# Patient Record
Sex: Female | Born: 1970 | Race: Black or African American | Hispanic: No | Marital: Single | Smoking: Never smoker
Health system: Southern US, Community
[De-identification: ages and names within clinical notes are randomized; demographics above are authoritative.]

## PROBLEM LIST (undated history)

## (undated) HISTORY — PX: OTHER SURGICAL HISTORY: SHX169

---

## 2014-03-01 ENCOUNTER — Emergency Department (HOSPITAL_COMMUNITY): Payer: Self-pay

## 2014-03-01 ENCOUNTER — Emergency Department (HOSPITAL_COMMUNITY)
Admission: EM | Admit: 2014-03-01 | Discharge: 2014-03-01 | Disposition: A | Discharge status: Home / Self Care | Payer: Self-pay | Attending: Emergency Medicine | Admitting: Emergency Medicine

## 2014-03-01 ENCOUNTER — Encounter (HOSPITAL_COMMUNITY): Payer: Self-pay

## 2014-03-01 DIAGNOSIS — Z8719 Personal history of other diseases of the digestive system: Secondary | ICD-10-CM | POA: Insufficient documentation

## 2014-03-01 DIAGNOSIS — M545 Low back pain: Secondary | ICD-10-CM | POA: Insufficient documentation

## 2014-03-01 DIAGNOSIS — E663 Overweight: Secondary | ICD-10-CM | POA: Insufficient documentation

## 2014-03-01 DIAGNOSIS — R102 Pelvic and perineal pain: Secondary | ICD-10-CM

## 2014-03-01 DIAGNOSIS — O26899 Other specified pregnancy related conditions, unspecified trimester: Secondary | ICD-10-CM

## 2014-03-01 DIAGNOSIS — N7011 Chronic salpingitis: Secondary | ICD-10-CM | POA: Insufficient documentation

## 2014-03-01 DIAGNOSIS — Z3202 Encounter for pregnancy test, result negative: Secondary | ICD-10-CM | POA: Insufficient documentation

## 2014-03-01 DIAGNOSIS — D259 Leiomyoma of uterus, unspecified: Secondary | ICD-10-CM | POA: Insufficient documentation

## 2014-03-01 LAB — COMPREHENSIVE METABOLIC PANEL
ALBUMIN: 3.4 g/dL — AB (ref 3.5–5.2)
ALK PHOS: 66 U/L (ref 39–117)
ALT: 7 U/L (ref 0–35)
AST: 13 U/L (ref 0–37)
Anion gap: 13 (ref 5–15)
BILIRUBIN TOTAL: 0.3 mg/dL (ref 0.3–1.2)
BUN: 6 mg/dL (ref 6–23)
CHLORIDE: 102 meq/L (ref 96–112)
CO2: 22 mEq/L (ref 19–32)
Calcium: 8.9 mg/dL (ref 8.4–10.5)
Creatinine, Ser: 0.65 mg/dL (ref 0.50–1.10)
GFR calc Af Amer: >90 mL/min (ref >90)
GFR calc non Af Amer: >90 mL/min (ref >90)
Glucose, Bld: 87 mg/dL (ref 70–99)
POTASSIUM: 3.7 meq/L (ref 3.7–5.3)
SODIUM: 137 meq/L (ref 137–147)
TOTAL PROTEIN: 6.8 g/dL (ref 6.0–8.3)

## 2014-03-01 LAB — URINALYSIS, ROUTINE W REFLEX MICROSCOPIC
Bilirubin Urine: NEGATIVE
Glucose, UA: NEGATIVE mg/dL
HGB URINE DIPSTICK: NEGATIVE
Ketones, ur: NEGATIVE mg/dL
LEUKOCYTES UA: NEGATIVE
Nitrite: NEGATIVE
PROTEIN: NEGATIVE mg/dL
SPECIFIC GRAVITY, URINE: 1.017 (ref 1.005–1.030)
Urobilinogen, UA: 0.2 mg/dL (ref 0.0–1.0)
pH: 7 (ref 5.0–8.0)

## 2014-03-01 LAB — CBC WITH DIFFERENTIAL/PLATELET
BASOS PCT: 1 % (ref 0–1)
Basophils Absolute: 0 10*3/uL (ref 0.0–0.1)
EOS ABS: 0 10*3/uL (ref 0.0–0.7)
Eosinophils Relative: 0 % (ref 0–5)
HCT: 36.7 % (ref 36.0–46.0)
HEMOGLOBIN: 11.8 g/dL — AB (ref 12.0–15.0)
Lymphocytes Relative: 35 % (ref 12–46)
Lymphs Abs: 1.7 10*3/uL (ref 0.7–4.0)
MCH: 26.3 pg (ref 26.0–34.0)
MCHC: 32.2 g/dL (ref 30.0–36.0)
MCV: 81.7 fL (ref 78.0–100.0)
MONOS PCT: 6 % (ref 3–12)
Monocytes Absolute: 0.3 10*3/uL (ref 0.1–1.0)
NEUTROS ABS: 2.8 10*3/uL (ref 1.7–7.7)
NEUTROS PCT: 58 % (ref 43–77)
PLATELETS: 398 10*3/uL (ref 150–400)
RBC: 4.49 MIL/uL (ref 3.87–5.11)
RDW: 15.4 % (ref 11.5–15.5)
WBC: 4.7 10*3/uL (ref 4.0–10.5)

## 2014-03-01 LAB — LIPASE, BLOOD: LIPASE: 21 U/L (ref 11–59)

## 2014-03-01 LAB — HIV ANTIBODY (ROUTINE TESTING W REFLEX): HIV: NONREACTIVE

## 2014-03-01 LAB — WET PREP, GENITAL
Trich, Wet Prep: NONE SEEN
Yeast Wet Prep HPF POC: NONE SEEN

## 2014-03-01 LAB — RPR

## 2014-03-01 LAB — PREGNANCY, URINE: PREG TEST UR: NEGATIVE

## 2014-03-01 MED ORDER — AZITHROMYCIN 250 MG PO TABS
1000.0000 mg | ORAL_TABLET | Freq: Once | ORAL | Status: AC
  Administered 2014-03-01: 1000 mg via ORAL
  Filled 2014-03-01: qty 4

## 2014-03-01 MED ORDER — STERILE WATER FOR INJECTION IJ SOLN
0.9000 mL | Freq: Once | INTRAMUSCULAR | Status: AC
  Administered 2014-03-01: 0.9 mL via INTRAMUSCULAR

## 2014-03-01 MED ORDER — METRONIDAZOLE 500 MG PO TABS: 500.0000 mg | ORAL_TABLET | Freq: Two times a day (BID) | ORAL | Status: AC

## 2014-03-01 MED ORDER — SODIUM CHLORIDE 0.9 % IV BOLUS (SEPSIS)
1000.0000 mL | Freq: Once | INTRAVENOUS | Status: AC
  Administered 2014-03-01: 1000 mL via INTRAVENOUS

## 2014-03-01 MED ORDER — IBUPROFEN 800 MG PO TABS
800.0000 mg | ORAL_TABLET | Freq: Once | ORAL | Status: AC
  Administered 2014-03-01: 800 mg via ORAL
  Filled 2014-03-01: qty 1

## 2014-03-01 MED ORDER — IOHEXOL 300 MG/ML  SOLN
80.0000 mL | Freq: Once | INTRAMUSCULAR | Status: AC | PRN
  Administered 2014-03-01: 80 mL via INTRAVENOUS

## 2014-03-01 MED ORDER — IOHEXOL 300 MG/ML  SOLN
25.0000 mL | Freq: Once | INTRAMUSCULAR | Status: AC | PRN
  Administered 2014-03-01: 25 mL via ORAL

## 2014-03-01 MED ORDER — DOXYCYCLINE HYCLATE 100 MG PO CAPS: 100.0000 mg | ORAL_CAPSULE | Freq: Two times a day (BID) | ORAL | Status: AC

## 2014-03-01 MED ORDER — CEFTRIAXONE SODIUM 250 MG IJ SOLR
250.0000 mg | Freq: Once | INTRAMUSCULAR | Status: AC
  Administered 2014-03-01: 250 mg via INTRAMUSCULAR
  Filled 2014-03-01: qty 250

## 2014-03-01 MED ORDER — MORPHINE SULFATE 2 MG/ML IJ SOLN
2.0000 mg | Freq: Once | INTRAMUSCULAR | Status: AC
Start: 2014-03-01 — End: 2014-03-01
  Administered 2014-03-01: 2 mg via INTRAVENOUS
  Filled 2014-03-01: qty 1

## 2014-03-01 NOTE — ED Notes (Signed)
Phlebotomy at the bedside  

## 2014-03-01 NOTE — Discharge Instructions (Signed)
Please call your doctor for a followup appointment within 24-48 hours. When you talk to your doctor please let them know that you were seen in the emergency department and have them acquire all of your records so that they can discuss the findings with you and formulate a treatment plan to fully care for your new and ongoing problems. Please call and set-up an appointment with Health and [place] Please call and set-up an appointment with OBGYN - within 2 weeks Please call and set-up an appointment with Health Department to be seen - please have partner(s) within the past 6 months tested as well for STDs Please take medications as prescribed and on a full stomach Please continue to monitor symptoms closely and if symptoms are to worsen or change (fever greater than 101, chills, sweating, nausea, vomiting, chest pain, shortness of breathe, difficulty breathing, weakness, numbness, tingling, worsening or changes to pain pattern, vaginal discharge, abnormal vaginal bleeding, worsening or changes to abdominal or back pain, dizziness, headaches) please report back to the Emergency Department immediately.   Pelvic Inflammatory Disease Pelvic inflammatory disease (PID) refers to an infection in some or all of the female organs. The infection can be in the uterus, ovaries, fallopian tubes, or the surrounding tissues in the pelvis. PID can cause abdominal or pelvic pain that comes on suddenly (acute pelvic pain). PID is a serious infection because it can lead to lasting (chronic) pelvic pain or the inability to have children (infertile).  CAUSES  The infection is often caused by the normal bacteria found in the vaginal tissues. PID may also be caused by an infection that is spread during sexual contact. PID can also occur following:   The birth of a baby.   A miscarriage.   An abortion.   Major pelvic surgery.   The use of an intrauterine device (IUD).   A sexual assault.  RISK  FACTORS Certain factors can put a person at higher risk for PID, such as:  Being younger than 25 years.  Being sexually active at [place] age.  Usingnonbarrier contraception.  Havingmultiple sexual partners.  Having sex with someone who has symptoms of a genital infection.  Using oral contraception. Other times, certain behaviors can increase the possibility of getting PID, such as:  Having sex during your period.  Using a vaginal douche.  Having an intrauterine device (IUD) in place. SYMPTOMS   Abdominal or pelvic pain.   Fever.   Chills.   Abnormal vaginal discharge.  Abnormal uterine bleeding.   Unusual pain shortly after finishing your period. DIAGNOSIS  Your caregiver will choose some of the following methods to make a diagnosis, such as:   Performinga physical exam and history. A pelvic exam typically reveals a very tender uterus and surrounding pelvis.   Ordering laboratory tests including a pregnancy test, blood tests, and urine test.  Orderingcultures of the vagina and cervix to check for a sexually transmitted infection (STI).  Performing an ultrasound.   Performing a laparoscopic procedure to look inside the pelvis.  TREATMENT   Antibiotic medicines may be prescribed and taken by mouth.   Sexual partners may be treated when the infection is caused by a sexually transmitted disease (STD).   Hospitalization may be needed to give antibiotics intravenously.  Surgery may be needed, but this is rare. It may take weeks until you are completely well. If you are diagnosed with PID, you should also be checked for human immunodeficiency virus (HIV). HOME CARE INSTRUCTIONS   If  given, take your antibiotics as directed. Finish the medicine even if you start to feel better.   Only take over-the-counter or prescription medicines for pain, discomfort, or fever as directed by your caregiver.   Do not have sexual intercourse until treatment is  completed or as directed by your caregiver. If PID is confirmed, your recent sexual partner(s) will need treatment.   Keep your follow-up appointments. SEEK MEDICAL CARE IF:   You have increased or abnormal vaginal discharge.   You need prescription medicine for your pain.   You vomit.   You cannot take your medicines.   Your partner has an STD.  SEEK IMMEDIATE MEDICAL CARE IF:   You have a fever.   You have increased abdominal or pelvic pain.   You have chills.   You have pain when you urinate.   You are not better after 72 hours following treatment.  MAKE SURE YOU:   Understand these instructions.  Will watch your condition.  Will get help right away if you are not doing well or get worse. Document Released: 03/16/2005 Document Revised: 07/11/2012 Document Reviewed: 03/12/2011 [facility] Patient Information 2015 [place], [place]. This information is not intended to replace advice given to you by your health care provider. Make sure you discuss any questions you have with your health care provider.    Emergency Department Resource Guide 1) Find a Doctor and Pay Out of Pocket Although you won't have to find out who is covered by your insurance plan, it is a good idea to ask around and get recommendations. You will then need to call the office and see if the doctor you have chosen will accept you as a new patient and what types of options they offer for patients who are self-pay. Some doctors offer discounts or will set up payment plans for their patients who do not have insurance, but you will need to ask so you aren't surprised when you get to your appointment.  2) Contact Your Local Health Department Not all health departments have doctors that can see patients for sick visits, but many do, so it is worth a call to see if yours does. If you don't know where your local health department is, you can check in your phone book. The CDC also has a tool to help you locate  your state's health department, and many state websites also have listings of all of their local health departments.  3) Find a [facility] If your illness is not likely to be very severe or complicated, you may want to try a walk in clinic. These are popping up all over the country in pharmacies, drugstores, and shopping centers. They're usually staffed by nurse practitioners or physician assistants that have been trained to treat common illnesses and complaints. They're usually fairly quick and inexpensive. However, if you have serious medical issues or chronic medical problems, these are probably not your best option.  No Primary Care Doctor: - Call Health Connect at  [phone] - they can help you locate a primary care doctor that  accepts your insurance, provides certain services, etc. - Physician Referral Service- [phone]  Chronic Pain Problems: Organization         Address  Phone   Notes  [facility]  [phone] Patients need to be referred by their primary care doctor.   Medication Assistance: Organization         Address  Phone   Notes  [facility] Medication Assistance Program  [address] [place], [place] [address] [phone] --Must be a resident of [facility] -- Must have NO insurance coverage whatsoever (no Medicaid/ Medicare, etc.) -- The pt. MUST have a primary care doctor that directs their care regularly and follows them in the community   MedAssist  [phone]   [employer]  [phone]    Agencies that provide inexpensive medical care: Organization         Address  Phone   Notes  [place]  [phone]   [facility]    [phone]   [facility] [place], [place] [address] [phone]   [place] [address] [place] [phone]   Planned Parenthood    [phone]   [facility]     [phone]   [place] and [place] [address], [place] Phone:  [phone], Fax:  [phone] Hours of Operation:  9 am - 6 pm, M-F.  Also accepts Medicaid/Medicare and self-pay.  [facility] for [place], [address], [place] Phone: [phone], Fax: [phone]. Hours of Operation:  8:30 am - 5:30 pm, M-F.  Also accepts Medicaid and self-pay.  [facility] [place] [address] [place] Phone: [phone]   [place], [place], [place] [phone], Ext. [phone] Mondays & Thursdays: 7-9 AM.  First 15 patients are seen on a first come, first serve basis.    [place] Providers:  Organization         Address  Phone   Notes  [facility] [address], [place] [phone] Also accepts self-pay patients.  [facility] [address], [place]  [phone]   [place], [address], [place] [phone]   [facility] [address], [place] [phone]   Lucianne Lei [address], [place]   [phone] Only accepts [place] Access [place] patients after they have their name applied to their card.   Self-Pay (no insurance) in [facility]:  Organization         Address  Phone   Notes  Sickle Cell Patients, [facility] [place] [phone]   [facility] [place] [phone]   [facility] [place]  [place], [place], [place] [phone]   [facility]/Dr. Osei-Bonsu  [address], [place] or [address], [place] [phone] Phone number for both [place] and [place] locations is the same.  [facility] [address] [place] [phone]   [facility] [address] [place] or [address] [phone]   [facility] [address] [place] [phone], phone; [phone], fax Sees patients 1st and 3rd Saturday of every month.  Must not qualify for public or private insurance (i.e. Medicaid, Medicare, [place] Health Choice, Veterans' Benefits)  Household income should be no more than 200% of the poverty level The clinic cannot treat you if you are pregnant or think you are pregnant  Sexually transmitted diseases are not treated at the clinic.  Dental Care: Organization         Address  Phone  Notes  [facility] Department of [facility] [place] [phone] Accepts children up to age 50 who are enrolled in [place] or [place]; pregnant women with a Medicaid card; and children who have applied for Medicaid or [place] Health Choice, but were declined, whose parents can pay a reduced fee at time of service.  [facility] Department of [facility]  [address], [place] [phone] Accepts children up to age 92 who are enrolled in [place] or [place]; pregnant women with a Medicaid card; and children who have applied for Medicaid or [place] Health Choice, but were declined, whose parents can pay a reduced fee at time of service.  [facility] Adult Dental Access PROGRAM  [place] [phone] Patients are seen by appointment only. Walk-ins are not accepted. [place] will see patients 45 years of age and older. Monday - Tuesday (8am-5pm) Most Wednesdays (8:30-5pm) $30 per visit, cash only  [facility] Adult Dental Access PROGRAM  [address], [facility] [phone] Patients are seen by appointment only. Walk-ins are not accepted. [place] will see patients 80 years of age and older. One Wednesday Evening (Monthly: Volunteer  Based).  $30 per visit, cash only  [place]  [phone] for adults; Children under age 51, call Graduate Pediatric Dentistry at [phone]. Children aged 54-14, please call [phone] to request a pediatric application.  Dental services are provided in all areas of dental care including fillings, crowns and bridges, complete and partial dentures, implants, gum treatment, root canals, and extractions. Preventive care is also provided. Treatment is provided to both adults and children. Patients are selected via a lottery and there is often a waiting list.   [facility] [address] [place]  [phone] [email or web address]   [facility] [address] [place], [place] [phone], Ext. [phone] Second and Fourth Thursday of each month, opens at 6:30 AM; Clinic ends at 9 AM.  Patients are seen on a first-come first-served basis, and a limited number are seen during each clinic.   [facility]  [address] [place]ard Danker [place], [place] [phone]   Eligibility Requirements You must have lived in [place], [place], or [place] counties for at least the last three months.   You cannot be eligible for state or federal sponsored [employer], including [employer], [place], or [employer].   You generally cannot be eligible for healthcare insurance through your employer.    How to apply: Eligibility screenings are held every Tuesday and Wednesday afternoon from 1:00 pm until 4:00 pm. You do not need an appointment for the interview!  [facility] [address], [place], [place]   [place]  [place] Department  [place]  [phone]    Behavioral Health Resources in the Community: Intensive Outpatient Programs Organization         Address  Phone  Notes  [place]. [address] [place], [place] [phone]   [facility]patient [address] [place], [place]   ADS: Alcohol & Drug S[facility] [address], [place], [place]   [place] [address],  [place], [place] or [phone]   Substance Abuse Resources Organization         Address  Phone  Notes  Alcohol and Drug Services  [place]  [phone]   [facility] [place]  [phone]   Chinita Pester  [phone]   Residential & Outpatient Substance Abuse Program  [phone]   Psychological Services Organization         Address  Phone  Notes  [facility] [place]  [place]  [phone]   [place] [address], [place] or [phone]    Mobile Crisis Teams Organization         Address  Phone  Notes  [facility], Mobile Crisis Care Unit  [phone]   [facility]  [address] [place], [place]   [place] Levels [address] [place] [phone]    Self-Help/Support Groups Organization         Address  Phone             Notes  [place]. of [place] - variety of support groups  [place] Call for more information  Narcotics Anonymous (NA), [facility] [address], [employer] [place]  2 meetings at this location   Special educational needs teacher         Address  Phone  Notes  [facility] Residential Treatment [place],    [place]  [phone]   [facility]  [address] [place] [address]96283, [place], [place]   [place], [place] [phone] Admissions: 8am-3pm M-F  Incentives Substance [place] [address]    [place], [place] [phone]   [facility] [address] Organ, [place], [place]    [facility] [address],  [place], [place]   [facility] - Intensive Outpatient [address], [place], [place]   [facility] ([place].) [place].,  [place], [place] [phone] or [phone]   [facility] ([facility]S) [address], [place], [place] Accepts Medicaid  [facility] [address]  [place] [phone] Substance Abuse/Addiction Treatment   [facility] Organization         Address  Phone  Notes  [facility]  [phone]   Domenic Schwab, PhD [address] Arlis Porta [place], [place]   [phone] or [phone]   [facility] [place], [place] [phone]   [facility] [address], [place], [place] [phone] Insurance/Medicaid/sponsorship through [facility] and Families [address]                                    [place], [place] [phone] [place] [address] [place], [place] [phone]    Dr. Adele Schilder  [phone]   [facility] Dept. 1) [address] [place] 2) [place] 3)  [place] [address], [place] [phone]  [phone]   [place] [phone] or [phone] (After Hours)

## 2014-03-01 NOTE — ED Notes (Signed)
PA at Bedside. 

## 2014-03-01 NOTE — ED Notes (Signed)
Pt here for lower back pain and lower abd pain for the past 2 days. No N/V/D. Denies any urinary sx. Hx of right ovarian cyst.

## 2014-03-01 NOTE — ED Provider Notes (Signed)
CSN: [id number]     Arrival date & time 03/01/14  1829 History   First MD Initiated Contact with Patient 03/01/14 [phone]     Chief Complaint  Patient presents with  . Back Pain  . Abdominal Pain     (Consider location/radiation/quality/duration/timing/severity/associated sxs/prior Treatment) The history is provided by the patient. No language interpreter was used.  Sherry Nguyen is a 43 y/o F with PMHx of ovarian cysts presenting to the ED with abdominal pain that has been ongoing for the past 4 days. Patient described the pain as a constant throbbing sensation with radiation to her lower back. Patient reported that the pain worsens with certain sitting positions and standing for long periods of time. Stated that she has used nothing for the pain. Patient reported that she has not been seen by an OBGYN in a long time. Denied fever, chills, chest pain, shortness of breath, difficulty breathing, weakness, numbness, tingling, urinary and bowel incontinence, nausea, vomiting, diarrhea, melena, hematochezia, fall, injuries, dizziness, headaches, hematuria, dysuria, vaginal bleeding/discharge/pain. Denied history of abdominal surgeries. LMP 02/24/2014.  PCP none   History reviewed. No pertinent past medical history. Past Surgical History  Procedure Laterality Date  . Gallstones     No family history on file. History  Substance Use Topics  . Smoking status: Never Smoker   . Smokeless tobacco: Not on file  . Alcohol Use: Yes     Comment: socially   OB History    No data available     Review of Systems  Constitutional: Negative for fever and chills.  Respiratory: Negative for chest tightness and shortness of breath.   Cardiovascular: Negative for chest pain.  Gastrointestinal: Positive for abdominal pain. Negative for nausea, vomiting, diarrhea, constipation, blood in stool and anal bleeding.  Genitourinary: Positive for pelvic pain. Negative for dysuria, hematuria, decreased urine volume,  vaginal bleeding, vaginal discharge and vaginal pain.  Musculoskeletal: Positive for back pain. Negative for neck pain and neck stiffness.  Neurological: Negative for dizziness, weakness, numbness and headaches.      Allergies  Review of patient's allergies indicates no known allergies.  Home Medications   Prior to Admission medications   Medication Sig Start Date End Date Taking? Authorizing Provider  OVER THE COUNTER MEDICATION Take 1 tablet by mouth at bedtime as needed. Sleep aid   Yes Historical Provider, MD  doxycycline (VIBRAMYCIN) 100 MG capsule Take 1 capsule (100 mg total) by mouth 2 (two) times daily. One po bid x 7 days 03/01/14   Mikell Camp, PA-C  metroNIDAZOLE (FLAGYL) 500 MG tablet Take 1 tablet (500 mg total) by mouth 2 (two) times daily. 03/01/14   Laden Fieldhouse, PA-C   BP 136/84 mmHg  Pulse 68  Temp(Src) 98.1 F (36.7 C) (Oral)  Resp 23  Ht 5' (1.524 m)  Wt 175 lb (79.379 kg)  BMI 34.18 kg/m2  SpO2 100%  LMP 02/24/2014 Physical Exam  Constitutional: She is oriented to person, place, and time. She appears well-developed and well-nourished. No distress.  HENT:  Head: Normocephalic and atraumatic.  Eyes: Conjunctivae and EOM are normal. Right eye exhibits no discharge. Left eye exhibits no discharge.  Neck: Normal range of motion. Neck supple. No tracheal deviation present.  Cardiovascular: Normal rate, regular rhythm and normal heart sounds.   Pulses:      Radial pulses are 2+ on the right side, and 2+ on the left side.       Dorsalis pedis pulses are 2+ on the right side, and  2+ on the left side.  Pulmonary/Chest: Effort normal and breath sounds normal. No respiratory distress. She has no wheezes. She has no rales.  Abdominal: Soft. Normal appearance and bowel sounds are normal. She exhibits no distension. There is tenderness in the right lower quadrant and suprapubic area. There is no rebound, no guarding and no CVA tenderness.  Overweight Negative  abdominal distension  BS normoactive in all 4 quadrants Abdomen soft upon palpation  Discomfort upon palpation to the RLQ and suprapubic region  Negative peritoneal signs   Genitourinary:  Pelvic Exam: negative swelling, erythema, inflammation, lesions, sores, deformities noted to the genitalia and vaginal canal. Negative blood in the vaginal vault. Negative discharge identified. Cervix identified negative friability or abnormalities noted. Mild CMT tenderness with pain upon palpation to suprapubic region and right adnexal tenderness. Negative left adnexal tenderness. Exam chaperoned with nurse, Hildred Alamin  Musculoskeletal: Normal range of motion.  Full ROM to upper and lower extremities without difficulty noted, negative ataxia noted.  Lymphadenopathy:    She has no cervical adenopathy.  Neurological: She is alert and oriented to person, place, and time. No cranial nerve deficit. She exhibits normal muscle tone. Coordination normal.  Cranial nerves III-XII grossly intact Strength 5+/5+ to upper and lower extremities bilaterally with resistance applied, equal distribution noted Negative arm drift Fine motor skills intact  Skin: Skin is warm and dry. No rash noted. She is not diaphoretic. No erythema.  Psychiatric: She has a normal mood and affect. Her behavior is normal. Thought content normal.  Nursing note and vitals reviewed.   ED Course  Procedures (including critical care time)  Results for orders placed or performed during the hospital encounter of 03/01/14  Wet prep, genital  Result Value Ref Range   Yeast Wet Prep HPF POC NONE SEEN NONE SEEN   Trich, Wet Prep NONE SEEN NONE SEEN   Clue Cells Wet Prep HPF POC MANY (A) NONE SEEN   WBC, Wet Prep HPF POC FEW (A) NONE SEEN  Urinalysis, Routine w reflex microscopic  Result Value Ref Range   Color, Urine YELLOW YELLOW   APPearance CLEAR CLEAR   Specific Gravity, Urine 1.017 1.005 - 1.030   pH 7.0 5.0 - 8.0   Glucose, UA NEGATIVE  NEGATIVE mg/dL   Hgb urine dipstick NEGATIVE NEGATIVE   Bilirubin Urine NEGATIVE NEGATIVE   Ketones, ur NEGATIVE NEGATIVE mg/dL   Protein, ur NEGATIVE NEGATIVE mg/dL   Urobilinogen, UA 0.2 0.0 - 1.0 mg/dL   Nitrite NEGATIVE NEGATIVE   Leukocytes, UA NEGATIVE NEGATIVE  Pregnancy, urine  Result Value Ref Range   Preg Test, Ur NEGATIVE NEGATIVE  Lipase, blood  Result Value Ref Range   Lipase 21 11 - 59 U/L  CBC with Differential  Result Value Ref Range   WBC 4.7 4.0 - 10.5 K/uL   RBC 4.49 3.87 - 5.11 MIL/uL   Hemoglobin 11.8 (L) 12.0 - 15.0 g/dL   HCT 36.7 36.0 - 46.0 %   MCV 81.7 78.0 - 100.0 fL   MCH 26.3 26.0 - 34.0 pg   MCHC 32.2 30.0 - 36.0 g/dL   RDW 15.4 11.5 - 15.5 %   Platelets 398 150 - 400 K/uL   Neutrophils Relative % 58 43 - 77 %   Neutro Abs 2.8 1.7 - 7.7 K/uL   Lymphocytes Relative 35 12 - 46 %   Lymphs Abs 1.7 0.7 - 4.0 K/uL   Monocytes Relative 6 3 - 12 %   Monocytes Absolute 0.3 0.1 -  1.0 K/uL   Eosinophils Relative 0 0 - 5 %   Eosinophils Absolute 0.0 0.0 - 0.7 K/uL   Basophils Relative 1 0 - 1 %   Basophils Absolute 0.0 0.0 - 0.1 K/uL  Comprehensive metabolic panel  Result Value Ref Range   Sodium 137 137 - 147 mEq/L   Potassium 3.7 3.7 - 5.3 mEq/L   Chloride 102 96 - 112 mEq/L   CO2 22 19 - 32 mEq/L   Glucose, Bld 87 70 - 99 mg/dL   BUN 6 6 - 23 mg/dL   Creatinine, Ser 0.65 0.50 - 1.10 mg/dL   Calcium 8.9 8.4 - 10.5 mg/dL   Total Protein 6.8 6.0 - 8.3 g/dL   Albumin 3.4 (L) 3.5 - 5.2 g/dL   AST 13 0 - 37 U/L   ALT 7 0 - 35 U/L   Alkaline Phosphatase 66 39 - 117 U/L   Total Bilirubin 0.3 0.3 - 1.2 mg/dL   GFR calc non Af Amer >90 >90 mL/min   GFR calc Af Amer >90 >90 mL/min   Anion gap 13 5 - 15    Labs Review Labs Reviewed  WET PREP, GENITAL - Abnormal; Notable for the following:    Clue Cells Wet Prep HPF POC MANY (*)    WBC, Wet Prep HPF POC FEW (*)    All other components within normal limits  CBC WITH DIFFERENTIAL - Abnormal; Notable  for the following:    Hemoglobin 11.8 (*)    All other components within normal limits  COMPREHENSIVE METABOLIC PANEL - Abnormal; Notable for the following:    Albumin 3.4 (*)    All other components within normal limits  GC/CHLAMYDIA PROBE AMP  URINALYSIS, ROUTINE W REFLEX MICROSCOPIC  PREGNANCY, URINE  LIPASE, BLOOD  HIV ANTIBODY (ROUTINE TESTING)  RPR    Imaging Review US Transvaginal Non-ob  03/01/2014   CLINICAL DATA:  Pelvic pain in female.  EXAM: TRANSABDOMINAL AND TRANSVAGINAL ULTRASOUND OF PELVIS  DOPPLER ULTRASOUND OF OVARIES  TECHNIQUE: Both transabdominal and transvaginal ultrasound examinations of the pelvis were performed. Transabdominal technique was performed for global imaging of the pelvis including uterus, ovaries, adnexal regions, and pelvic cul-de-sac.  It was necessary to proceed with endovaginal exam following the transabdominal exam to visualize the endometrium and ovaries. Color and duplex Doppler ultrasound was utilized to evaluate blood flow to the ovaries.  COMPARISON:  CT scan of same day.  FINDINGS: Uterus  Measurements: 9.0 x 6.4 x 4.8 cm. Multiple fibroids are noted, with the largest measuring 3.7 x 3.1 x 3.0 cm arising from the fundus.  Endometrium  Thickness: 6.1 mm which is within normal limits. No focal abnormality visualized.  Right ovary  Measurements: 2.8 x 2.1 x 1.9 cm. Serpiginous tubular structure is noted in right adnexal region consistent with hydrosalpinx. 1.4 cm simple cyst is noted most likely representing follicular cyst.  Left ovary  Not visualized.  Pulsed Doppler evaluation of right ovary demonstrates normal low-resistance arterial and venous waveforms.  Other findings  No free fluid.  IMPRESSION: Fibroid uterus is noted. Right hydrosalpinx is noted. Left ovary is not visualized.   Electronically Signed   By: Sabino Dick M.D.   On: 03/01/2014 14:42   US Pelvis Complete  03/01/2014   CLINICAL DATA:  Pelvic pain in female.  EXAM: TRANSABDOMINAL AND  TRANSVAGINAL ULTRASOUND OF PELVIS  DOPPLER ULTRASOUND OF OVARIES  TECHNIQUE: Both transabdominal and transvaginal ultrasound examinations of the pelvis were performed. Transabdominal technique was performed  for global imaging of the pelvis including uterus, ovaries, adnexal regions, and pelvic cul-de-sac.  It was necessary to proceed with endovaginal exam following the transabdominal exam to visualize the endometrium and ovaries. Color and duplex Doppler ultrasound was utilized to evaluate blood flow to the ovaries.  COMPARISON:  CT scan of same day.  FINDINGS: Uterus  Measurements: 9.0 x 6.4 x 4.8 cm. Multiple fibroids are noted, with the largest measuring 3.7 x 3.1 x 3.0 cm arising from the fundus.  Endometrium  Thickness: 6.1 mm which is within normal limits. No focal abnormality visualized.  Right ovary  Measurements: 2.8 x 2.1 x 1.9 cm. Serpiginous tubular structure is noted in right adnexal region consistent with hydrosalpinx. 1.4 cm simple cyst is noted most likely representing follicular cyst.  Left ovary  Not visualized.  Pulsed Doppler evaluation of right ovary demonstrates normal low-resistance arterial and venous waveforms.  Other findings  No free fluid.  IMPRESSION: Fibroid uterus is noted. Right hydrosalpinx is noted. Left ovary is not visualized.   Electronically Signed   By: Sabino Dick M.D.   On: 03/01/2014 14:42   Ct Abdomen Pelvis W Contrast  03/01/2014   CLINICAL DATA:  Progressive lower abdominal pain and back pain for 2 days.  EXAM: CT ABDOMEN AND PELVIS WITH CONTRAST  TECHNIQUE: Multidetector CT imaging of the abdomen and pelvis was performed using the standard protocol following bolus administration of intravenous contrast.  CONTRAST:  26mL OMNIPAQUE IOHEXOL 300 MG/ML  SOLN  COMPARISON:  None.  FINDINGS: The patient has a right hydrosalpinx. The ovaries appear normal. There are numerous fibroids of varying sizes in the uterus, the largest being in the fundus, 3.6 cm in diameter.   Gallbladder has been removed. Biliary tree is otherwise normal. Liver, spleen, pancreas, adrenal glands, and kidneys are normal except for a 5 mm cyst in posterior aspect of the mid right kidney.  The bowel is normal including the terminal ileum and appendix. No adenopathy. No free air or free fluid. No acute osseous abnormality. Small disc protrusion central and to the left at L4-5. Broad-based disc bulge at L5-S1.  IMPRESSION: Right hydrosalpinx. Numerous fibroids in the uterus. Small disc protrusion at L4-5.   Electronically Signed   By: Rozetta Nunnery M.D.   On: 03/01/2014 13:26   [place] Art/ven Flow Abd Pelv Doppler  03/01/2014   CLINICAL DATA:  Pelvic pain in female.  EXAM: TRANSABDOMINAL AND TRANSVAGINAL ULTRASOUND OF PELVIS  DOPPLER ULTRASOUND OF OVARIES  TECHNIQUE: Both transabdominal and transvaginal ultrasound examinations of the pelvis were performed. Transabdominal technique was performed for global imaging of the pelvis including uterus, ovaries, adnexal regions, and pelvic cul-de-sac.  It was necessary to proceed with endovaginal exam following the transabdominal exam to visualize the endometrium and ovaries. Color and duplex Doppler ultrasound was utilized to evaluate blood flow to the ovaries.  COMPARISON:  CT scan of same day.  FINDINGS: Uterus  Measurements: 9.0 x 6.4 x 4.8 cm. Multiple fibroids are noted, with the largest measuring 3.7 x 3.1 x 3.0 cm arising from the fundus.  Endometrium  Thickness: 6.1 mm which is within normal limits. No focal abnormality visualized.  Right ovary  Measurements: 2.8 x 2.1 x 1.9 cm. Serpiginous tubular structure is noted in right adnexal region consistent with hydrosalpinx. 1.4 cm simple cyst is noted most likely representing follicular cyst.  Left ovary  Not visualized.  Pulsed Doppler evaluation of right ovary demonstrates normal low-resistance arterial and venous waveforms.  Other findings  No  free fluid.  IMPRESSION: Fibroid uterus is noted. Right hydrosalpinx  is noted. Left ovary is not visualized.   Electronically Signed   By: Sabino Dick M.D.   On: 03/01/2014 14:42     EKG Interpretation None      11:59 PM Patient reported that she is interested in being tested for HIV and syphilis secondary to unprotected sexual encounter approximately 3 weeks ago with a new partner.  3:26 PM This provider spoke with Dr. Roselie Awkward, on-call physician for OB/GYN. Discussed case, labs, imaging and vitals in great detail. As per physician agreed with STD prophylactic medication with Rocephin and azithromycin. As per physician recommended patient be discharged with doxycycline and Flagyl, recommended patient to be followed up as an outpatient within 2 weeks. Reported that since patient has no leukocytosis and is stable, afebrile patient to be discharged home with outpatient follow-up.  MDM   Final diagnoses:  Pelvic pain affecting pregnancy  Pelvic pain in female  Hydrosalpinx  Uterine leiomyoma, unspecified location    Medications  cefTRIAXone (ROCEPHIN) injection 250 mg (not administered)  azithromycin (ZITHROMAX) tablet 1,000 mg (not administered)  sterile water (preservative free) injection 0.9 mL (not administered)  sodium chloride 0.9 % bolus 1,000 mL (0 mLs Intravenous Stopped 03/01/14 1227)  iohexol (OMNIPAQUE) 300 MG/ML solution 25 mL (25 mLs Oral Contrast Given 03/01/14 1044)  morphine 2 MG/ML injection 2 mg (2 mg Intravenous Given 03/01/14 1209)  iohexol (OMNIPAQUE) 300 MG/ML solution 80 mL (80 mLs Intravenous Contrast Given 03/01/14 1241)    Filed Vitals:   03/01/14 0857 03/01/14 1000 03/01/14 1029 03/01/14 1200  BP: 129/87 139/91 139/91 136/84  Pulse:  64 92 68  Temp: 98.1 F (36.7 C)     TempSrc: Oral     Resp: 18 17 19 23   Height: 5' (1.524 m)     Weight: 175 lb (79.379 kg)     SpO2: 100% 99% 100% 100%   CBC negative elevated leukocytosis. Hemoglobin 11.8, hematocrit 36.7. CMP unremarkable-glucose 87, negative elevated anion gap 13.0 mEq  per liter. Lipase negative elevation. Urinalysis negative for infection-negative nitrites or leukocytes identified-negative hemoglobin. Urine pregnancy negative. Wet prep noted many clue cells and a few white blood cells. HIV, RPR, GC Chlamydia probe pending. CT abdomen and pelvis with contrast identified right hydrosalpinx with numerous fibroids in uterus-small disc protrusion at L4-L5. Pelvic ultrasound noted uterine fibroids. Right hydrosalpinx is noted. Left ovary is not visualized. Doubt acute appendicitis. Doubt acute cholecystitis/cholangitis. Doubt pancreatitis. Negative findings of acute abdominal processes. Imaging identified right hydrosalpinx. This provider discussed case in great detail with on-call physician for OB/GYN, Dr. Arnold-recommended patient to be treated prophylactically with STD medication discharged with Flagyl and doxycycline. Patient stable, afebrile. Patient septic appearing. Discharged patient. Discharge patient with Flagyl and doxycycline. Discussed with patient that she is to follow-up with OB/GYN with an approximate 2 weeks. Discussed with patient to rest and stay hydrated. Discussed with patient safe sex habits-recommended patient to not have any sexual interactions until pending labs have returned and patient has been seen and assessed by OB/GYN. Referred patient to health department for partners within the past 6 months to be re-assessed. Discussed with patient to closely monitor symptoms and if symptoms are to worsen or change to report back to the ED - strict return instructions given.  Patient agreed to plan of care, understood, all questions answered.    Incorrect diagnosis of pelvic pain affecting pregnancy placed-patient is not pregnant. Final diagnosis is right hydrosalpinx resulting in  pelvic pain in female.  Jamse Mead, PA-C 03/01/14 1621  Jamse Mead, PA-C 03/01/14 Keiser, PA-C 03/01/14 Ford, DO 03/02/14 [phone]

## 2014-03-02 LAB — GC/CHLAMYDIA PROBE AMP
CT PROBE, AMP APTIMA: NEGATIVE
GC Probe RNA: NEGATIVE

## 2015-04-05 IMAGING — CT CT ABD-PELV W/ CM
2 of 5 series · 5 of 46 positions shown, 7 images · IV contrast (omnipaque)
Comparison: None.

CLINICAL DATA: Progressive lower abdominal pain and back pain for 2
days.

EXAM:
CT ABDOMEN AND PELVIS WITH CONTRAST
TECHNIQUE: Multidetector CT imaging of the abdomen and pelvis was performed
using the standard protocol following bolus administration of
intravenous contrast.
CONTRAST:  80mL OMNIPAQUE IOHEXOL 300 MG/ML  SOLN

[Series 204: cor · coronal · 0.50mm/px · 4 of 110 slices shown, 5 images]
[im 25/110  soft-tissue]
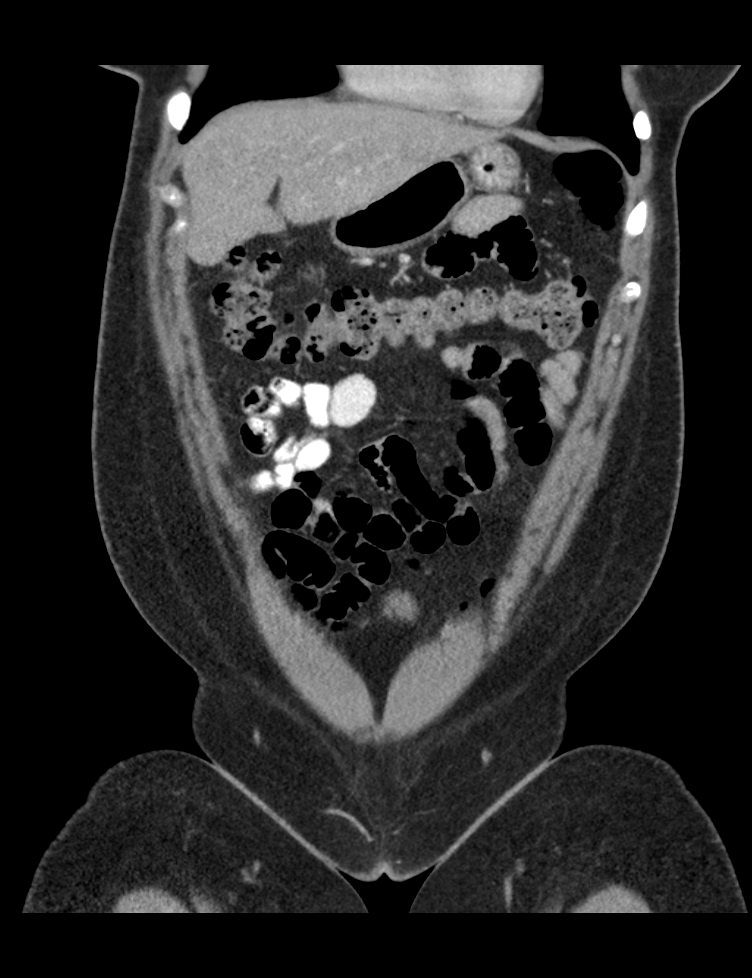
[im 25/110  bone]
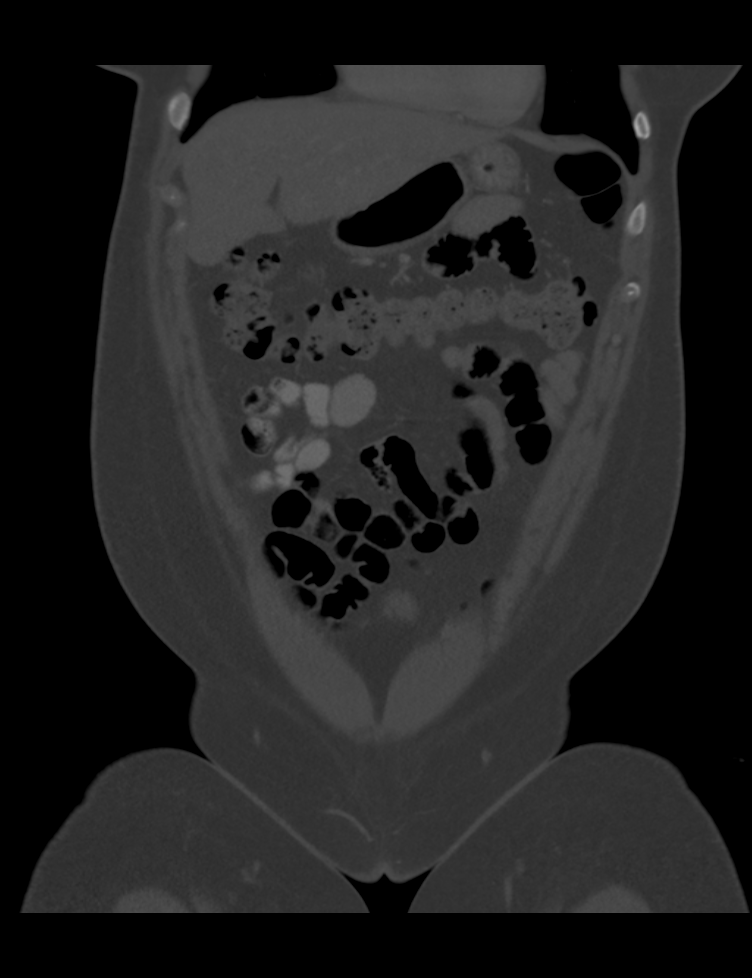
[im 49/110  soft-tissue]
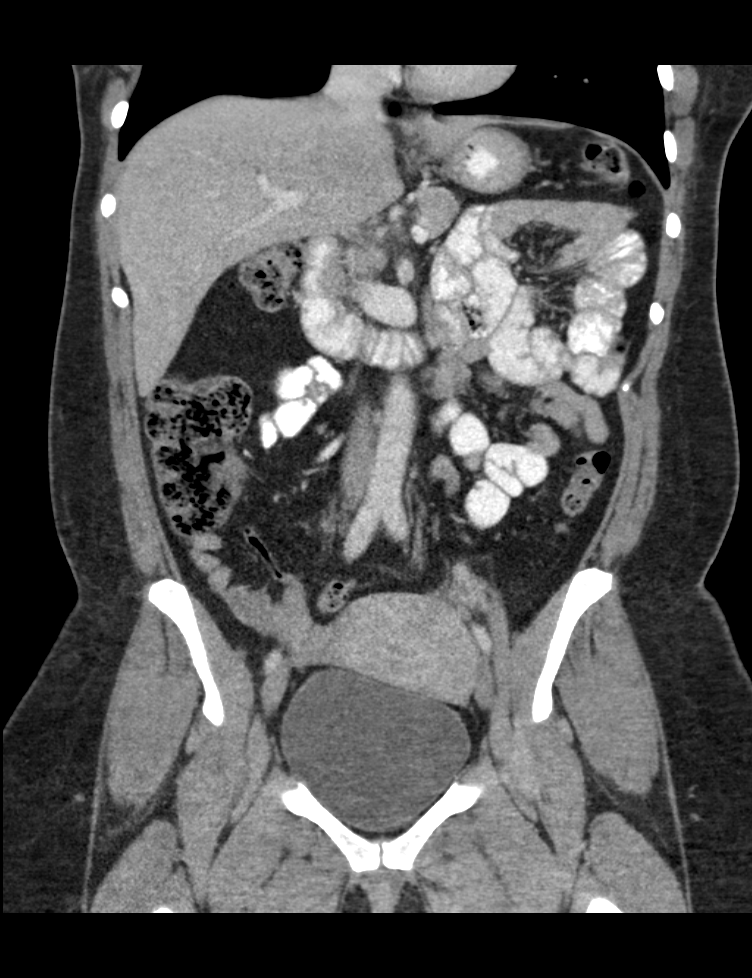
[im 73/110  soft-tissue]
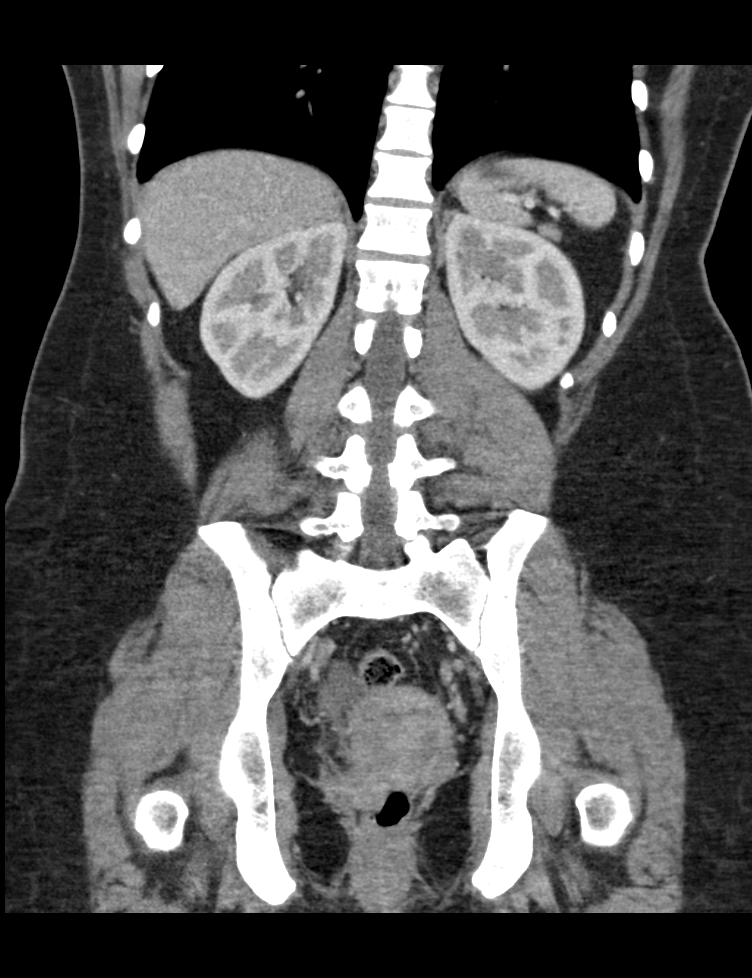
[im 97/110  soft-tissue]
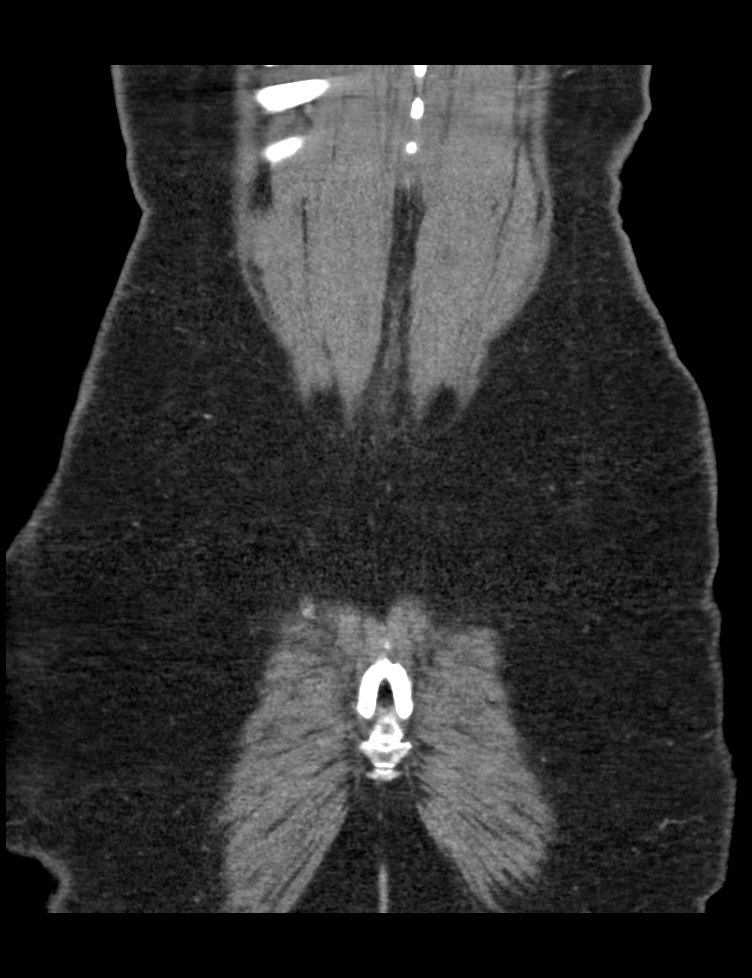

[Series 205: sag · sagittal · 0.50mm/px · 1 of 143 slices shown, 2 images]
[im 48/143  soft-tissue]
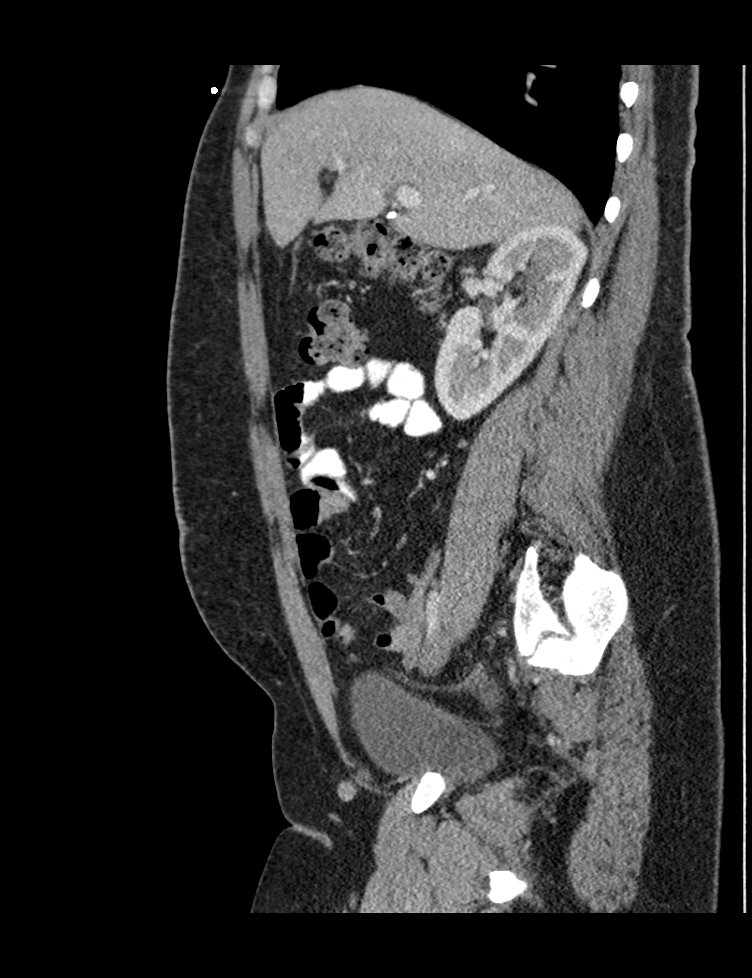
[im 48/143  bone]
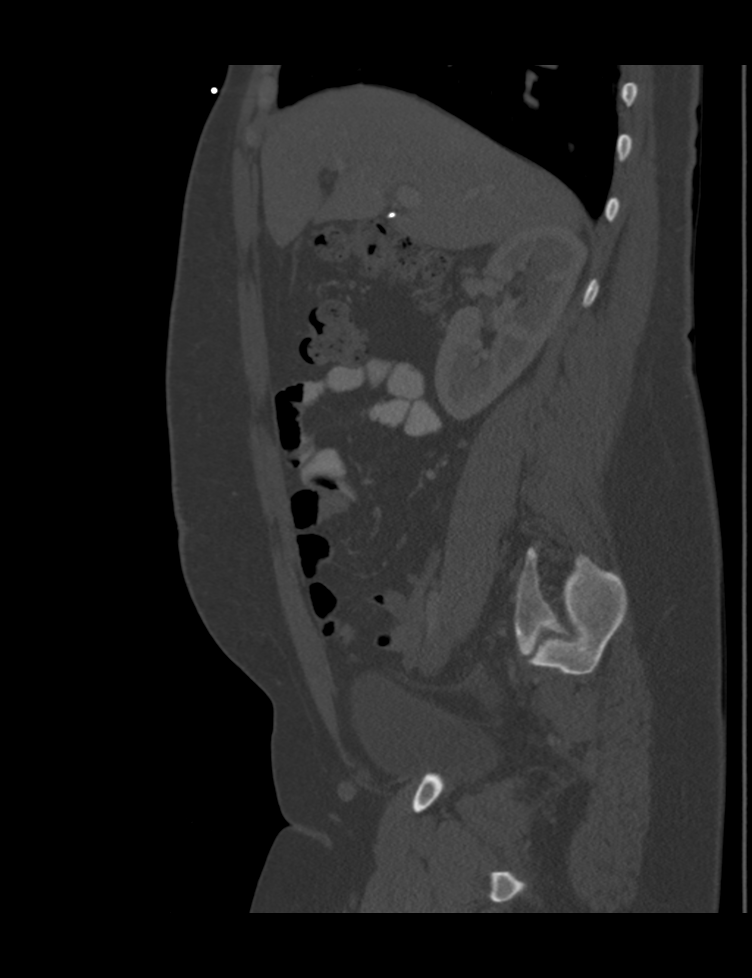

[5 of 46 positions shown; findings below may reference images not displayed]

FINDINGS: The patient has a right hydrosalpinx. The ovaries appear normal.
There are numerous fibroids of varying sizes in the uterus, the
largest being in the fundus, 3.6 cm in diameter.

Gallbladder has been removed. Biliary tree is otherwise normal.
Liver, spleen, pancreas, adrenal glands, and kidneys are normal
except for a 5 mm cyst in posterior aspect of the mid right kidney.

The bowel is normal including the terminal ileum and appendix. No
adenopathy. No free air or free fluid. No acute osseous abnormality.
Small disc protrusion central and to the left at L4-5. Broad-based
disc bulge at L5-S1.
IMPRESSION: Right hydrosalpinx. Numerous fibroids in the uterus. Small disc
protrusion at L4-5.
